# Patient Record
Sex: Male | Born: 2000 | Race: White | Hispanic: No | Marital: Single | Smoking: Never smoker
Health system: Southern US, Community
[De-identification: ages and names within clinical notes are randomized; demographics above are authoritative.]

---
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
## 2019-02-22 ENCOUNTER — Other Ambulatory Visit: Payer: Self-pay

## 2019-02-22 DIAGNOSIS — Z20822 Contact with and (suspected) exposure to covid-19: Secondary | ICD-10-CM

## 2019-02-23 LAB — NOVEL CORONAVIRUS, NAA: SARS-CoV-2, NAA: NOT DETECTED
